# Patient Record
Sex: Female | Born: 1940 | Race: White | Hispanic: No | Marital: Single
Health system: Southern US, Community
[De-identification: ages and names within clinical notes are randomized; demographics above are authoritative.]

---

## 2001-10-08 ENCOUNTER — Other Ambulatory Visit: Admission: RE | Admit: 2001-10-08 | Discharge: 2001-10-08 | Payer: Self-pay | Admitting: Family Medicine

## 2005-01-09 ENCOUNTER — Ambulatory Visit: Payer: Self-pay | Admitting: Family Medicine

## 2005-04-30 ENCOUNTER — Ambulatory Visit: Payer: Self-pay | Admitting: Family Medicine

## 2006-07-02 ENCOUNTER — Ambulatory Visit: Payer: Self-pay | Admitting: Family Medicine

## 2007-07-28 ENCOUNTER — Ambulatory Visit: Payer: Self-pay

## 2008-08-18 ENCOUNTER — Ambulatory Visit: Payer: Self-pay | Admitting: Family Medicine

## 2009-10-24 ENCOUNTER — Ambulatory Visit: Payer: Self-pay | Admitting: Unknown Physician Specialty

## 2010-01-23 ENCOUNTER — Ambulatory Visit: Payer: Self-pay | Admitting: Gastroenterology

## 2011-03-07 ENCOUNTER — Ambulatory Visit: Payer: Self-pay | Admitting: Unknown Physician Specialty

## 2012-03-14 ENCOUNTER — Ambulatory Visit: Payer: Self-pay | Admitting: Unknown Physician Specialty

## 2012-05-20 ENCOUNTER — Ambulatory Visit: Payer: Self-pay | Admitting: Obstetrics and Gynecology

## 2012-05-20 LAB — BASIC METABOLIC PANEL
Anion Gap: 6 — ABNORMAL LOW (ref 7–16)
BUN: 12 mg/dL (ref 7–18)
Co2: 28 mmol/L (ref 21–32)
Creatinine: 0.69 mg/dL (ref 0.60–1.30)
EGFR (African American): >60
EGFR (Non-African Amer.): >60
Glucose: 85 mg/dL (ref 65–99)
Potassium: 4 mmol/L (ref 3.5–5.1)

## 2012-05-20 LAB — CBC
HCT: 38.9 % (ref 35.0–47.0)
HGB: 13.2 g/dL (ref 12.0–16.0)
MCHC: 34 g/dL (ref 32.0–36.0)
Platelet: 237 10*3/uL (ref 150–440)
RBC: 4.17 10*6/uL (ref 3.80–5.20)
RDW: 13.6 % (ref 11.5–14.5)
WBC: 6.4 10*3/uL (ref 3.6–11.0)

## 2012-05-26 ENCOUNTER — Ambulatory Visit: Payer: Self-pay | Admitting: Obstetrics and Gynecology

## 2012-05-27 LAB — HEMOGLOBIN: HGB: 11.6 g/dL — ABNORMAL LOW (ref 12.0–16.0)

## 2012-05-27 LAB — URINALYSIS, COMPLETE
Bilirubin,UR: NEGATIVE
Ph: 5 (ref 4.5–8.0)
Protein: 30
Specific Gravity: 1.028 (ref 1.003–1.030)
Squamous Epithelial: NONE SEEN
WBC UR: NONE SEEN /HPF (ref 0–5)

## 2012-05-27 LAB — CREATININE, SERUM
Creatinine: 0.67 mg/dL (ref 0.60–1.30)
EGFR (African American): >60
EGFR (Non-African Amer.): >60

## 2012-06-14 ENCOUNTER — Emergency Department: Payer: Self-pay | Admitting: Emergency Medicine

## 2012-06-14 LAB — BASIC METABOLIC PANEL
Anion Gap: 9 (ref 7–16)
BUN: 10 mg/dL (ref 7–18)
Calcium, Total: 9.1 mg/dL (ref 8.5–10.1)
Chloride: 105 mmol/L (ref 98–107)
Co2: 27 mmol/L (ref 21–32)
EGFR (African American): >60
EGFR (Non-African Amer.): >60
Osmolality: 281 (ref 275–301)
Sodium: 141 mmol/L (ref 136–145)

## 2012-06-14 LAB — URINALYSIS, COMPLETE
Bacteria: NONE SEEN
Glucose,UR: NEGATIVE mg/dL (ref 0–75)
Nitrite: NEGATIVE
Ph: 7 (ref 4.5–8.0)
Protein: NEGATIVE
RBC,UR: 7 /HPF (ref 0–5)
Squamous Epithelial: NONE SEEN
WBC UR: 2 /HPF (ref 0–5)

## 2012-06-14 LAB — CBC
HCT: 41.7 % (ref 35.0–47.0)
HGB: 14.4 g/dL (ref 12.0–16.0)
MCH: 32.1 pg (ref 26.0–34.0)
MCHC: 34.6 g/dL (ref 32.0–36.0)
RBC: 4.5 10*6/uL (ref 3.80–5.20)

## 2012-06-14 LAB — PROTIME-INR: INR: 0.8

## 2012-09-16 IMAGING — MG MM CAD SCREENING MAMMO
1 series · 4 of 4 positions shown · non-contrast
Comparison: none

REASON FOR EXAM: SCR MAMMO NO ORDER
COMMENTS:  Submitted by practice: Princess Gall [HOSPITAL] Scheduled by
user: Gianmarcos

PROCEDURE:     MAM - MAM DGTL SCRN MAM NO ORDER W/CAD  - March 14, 2012  [DATE]
RESULT:
Comparisons: 03/07/2011 and 10/24/2009.

[R CC · right · 4 of 4 slices shown]
[im 1/4]
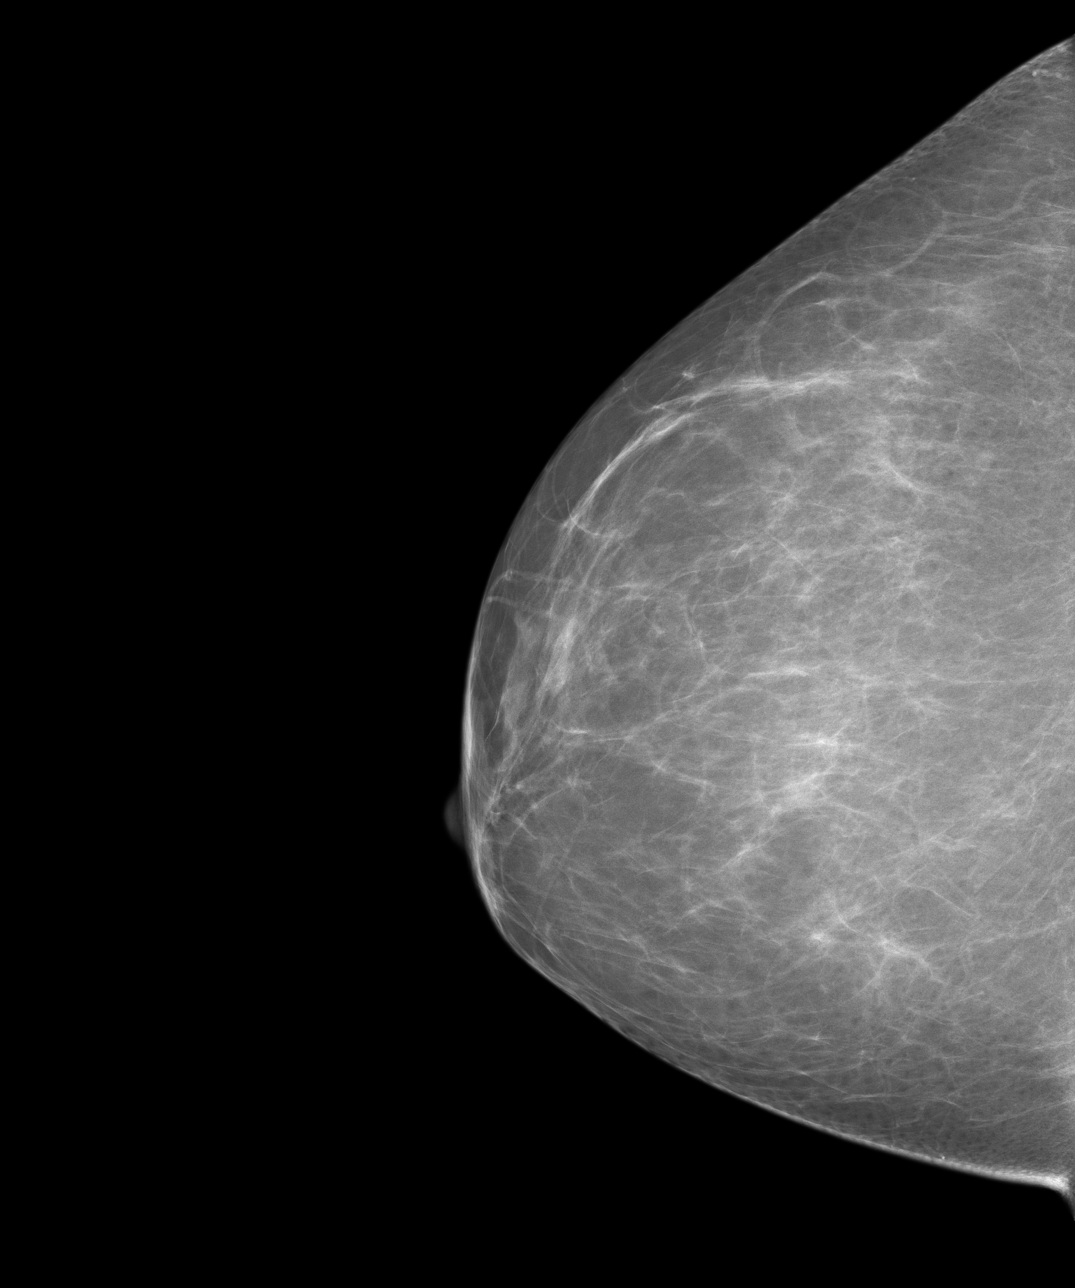
[im 2/4]
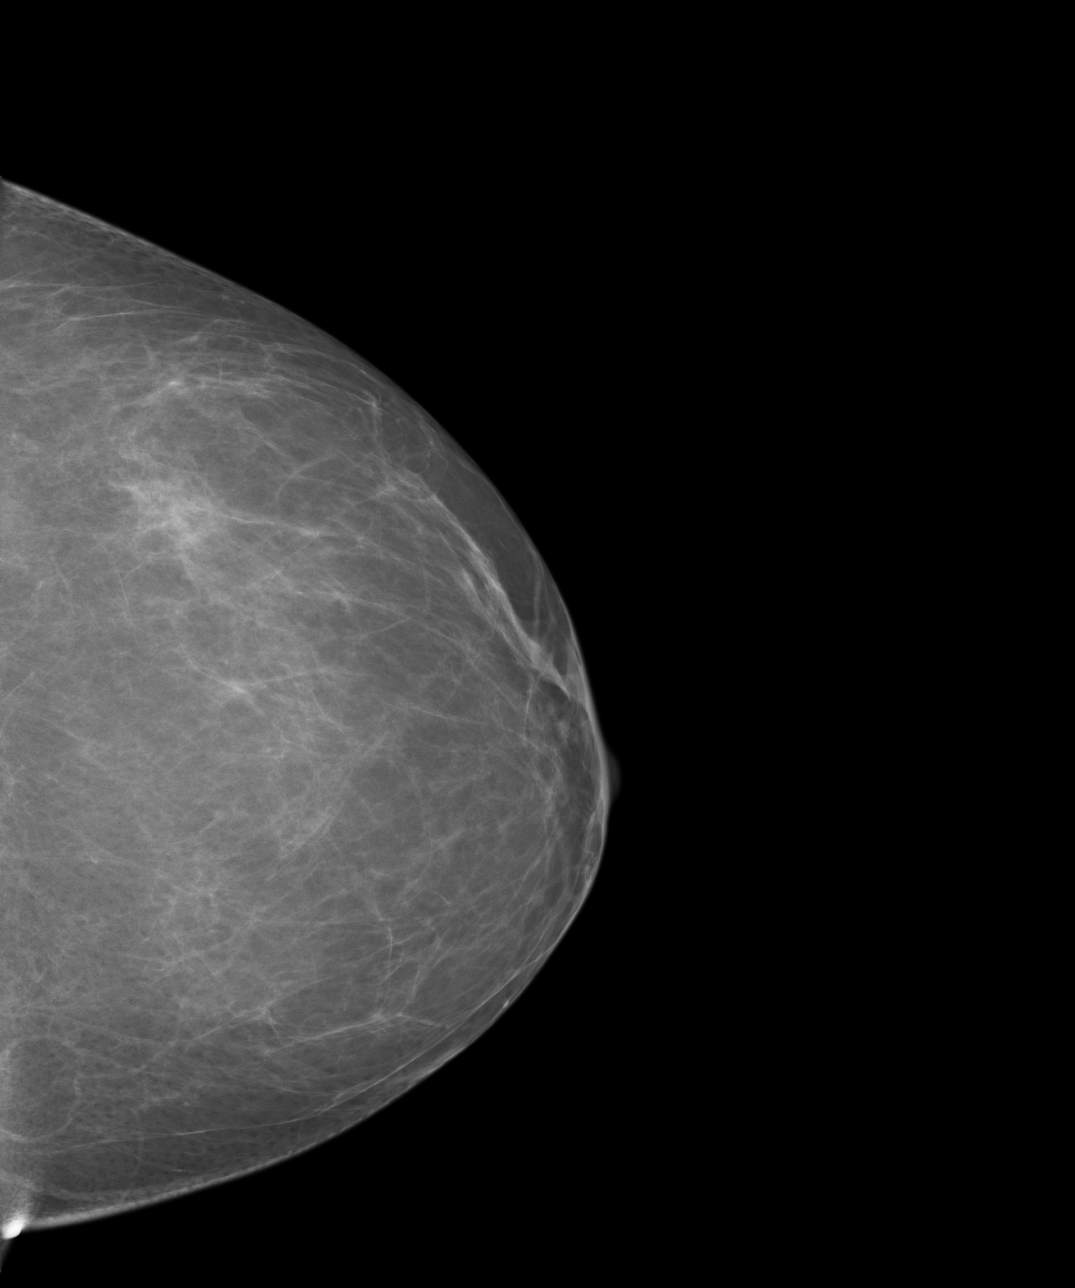
[im 3/4]
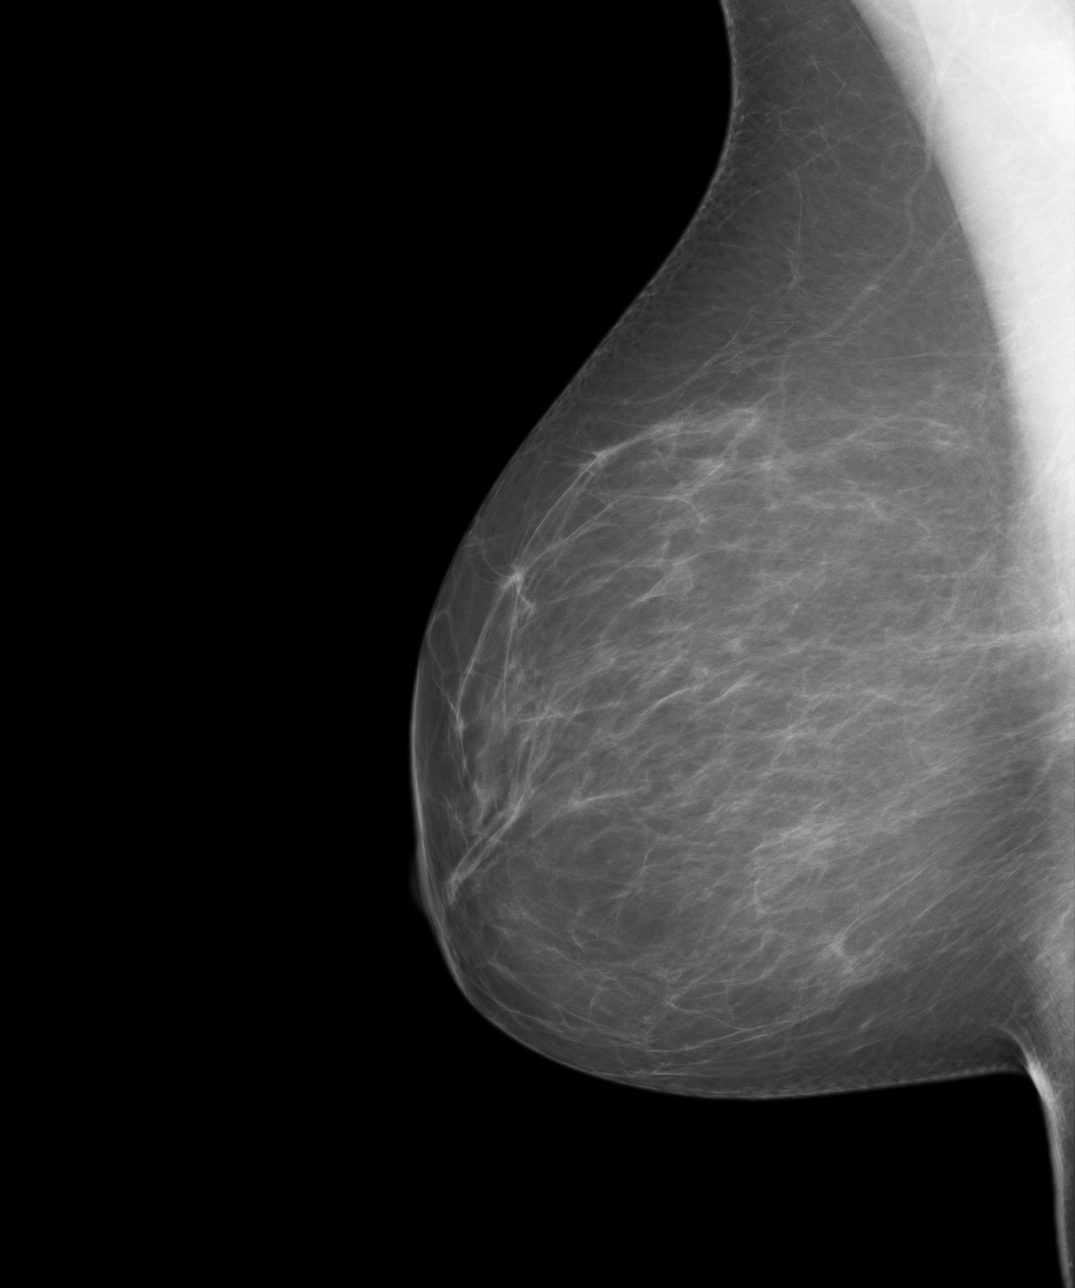
[im 4/4]
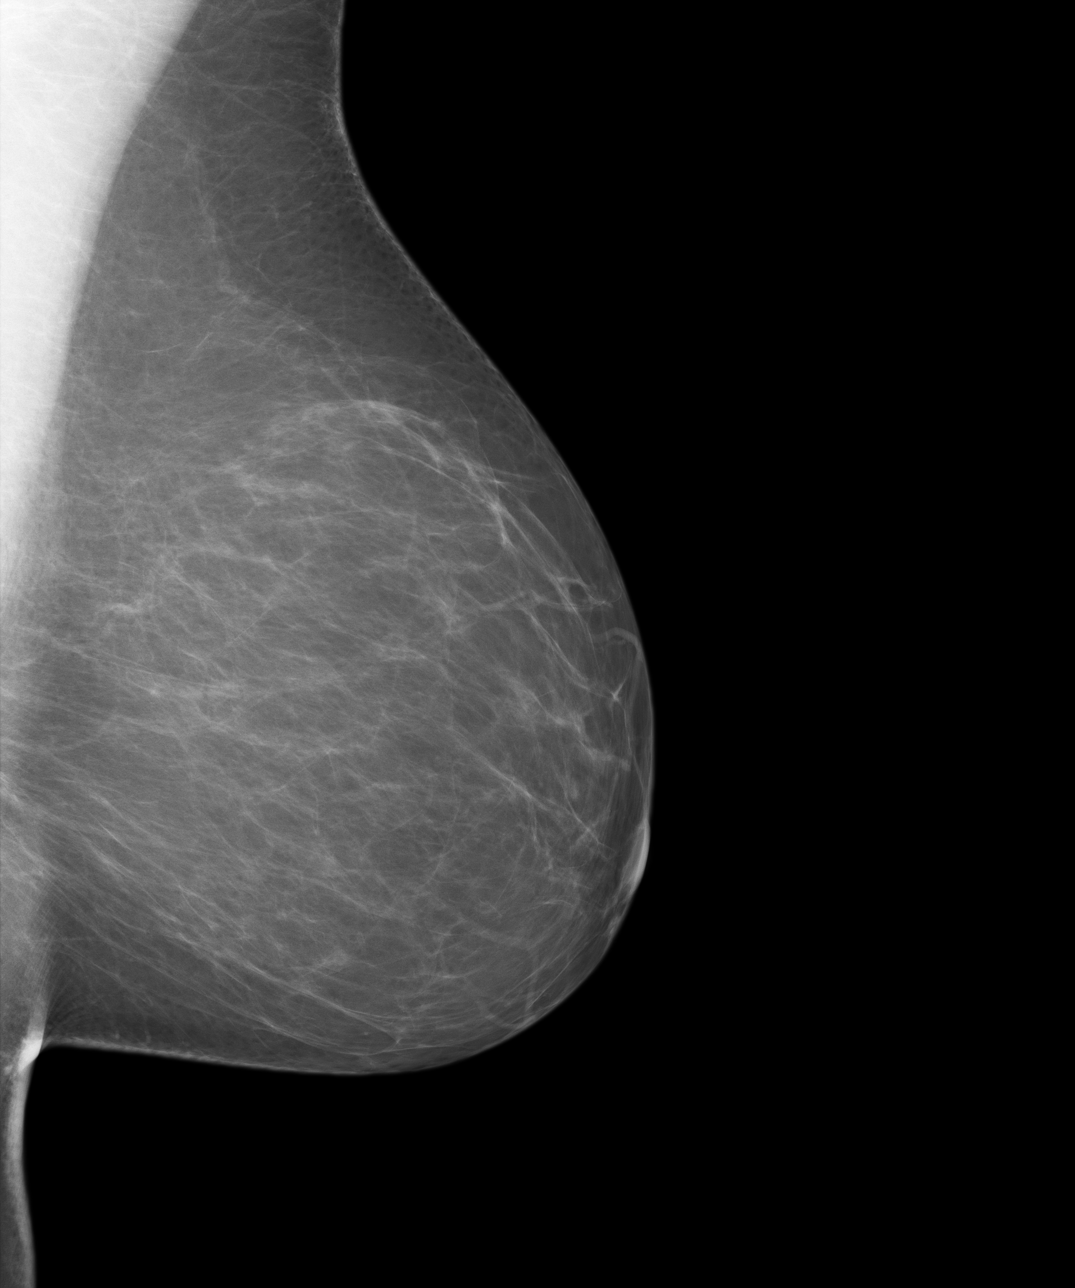

[4 of 4 positions shown; findings below may reference images not displayed]

FINDINGS: The breast tissue is predominant fatty. No suspicious masses or
calcifications are identified. No areas of architectural distortion.
IMPRESSION: BI-RADS: Category 1 - Negative

Recommend continued annual screening mammography.

A NEGATIVE MAMMOGRAM REPORT DOES NOT PRECLUDE BIOPSY OR OTHER EVALUATION OF
A CLINICALLY PALPABLE OR OTHERWISE SUSPICIOUS MASS OR LESION. BREAST CANCER
MAY NOT BE DETECTED BY MAMMOGRAPHY IN UP TO 10% OF CASES.

## 2013-04-01 ENCOUNTER — Ambulatory Visit: Payer: Self-pay

## 2013-04-02 ENCOUNTER — Ambulatory Visit: Payer: Self-pay

## 2013-10-05 IMAGING — MG MM ADDITIONAL VIEWS AT NO CHARGE
1 series · 5 of 5 positions shown · non-contrast
Comparison: none

REASON FOR EXAM: av rt density
COMMENTS:

[R XCCM · right · 5 of 5 slices shown]
[im 1/5]
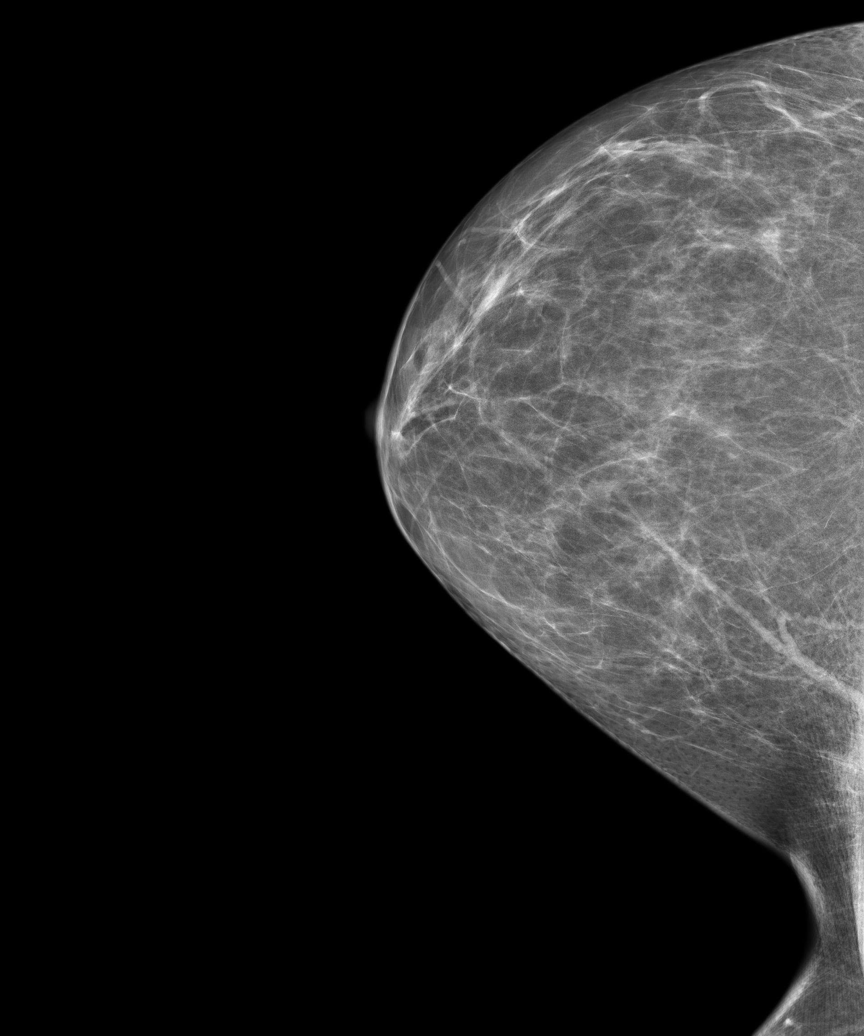
[im 2/5]
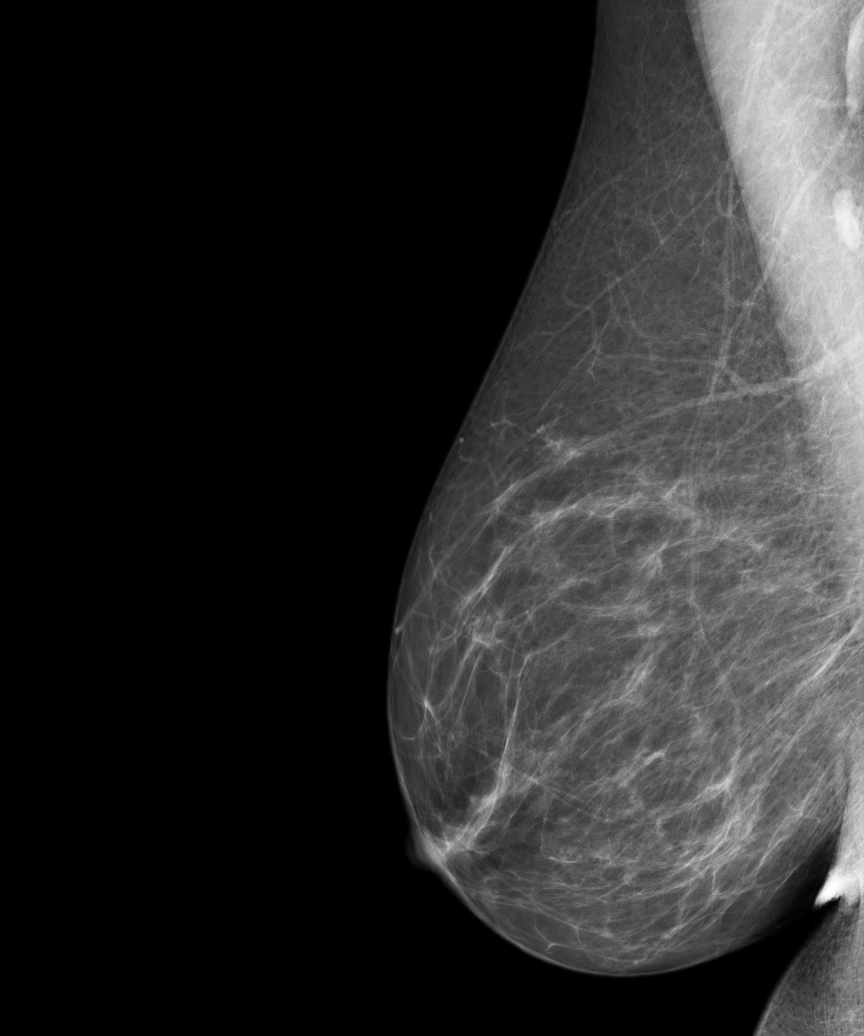
[im 3/5]
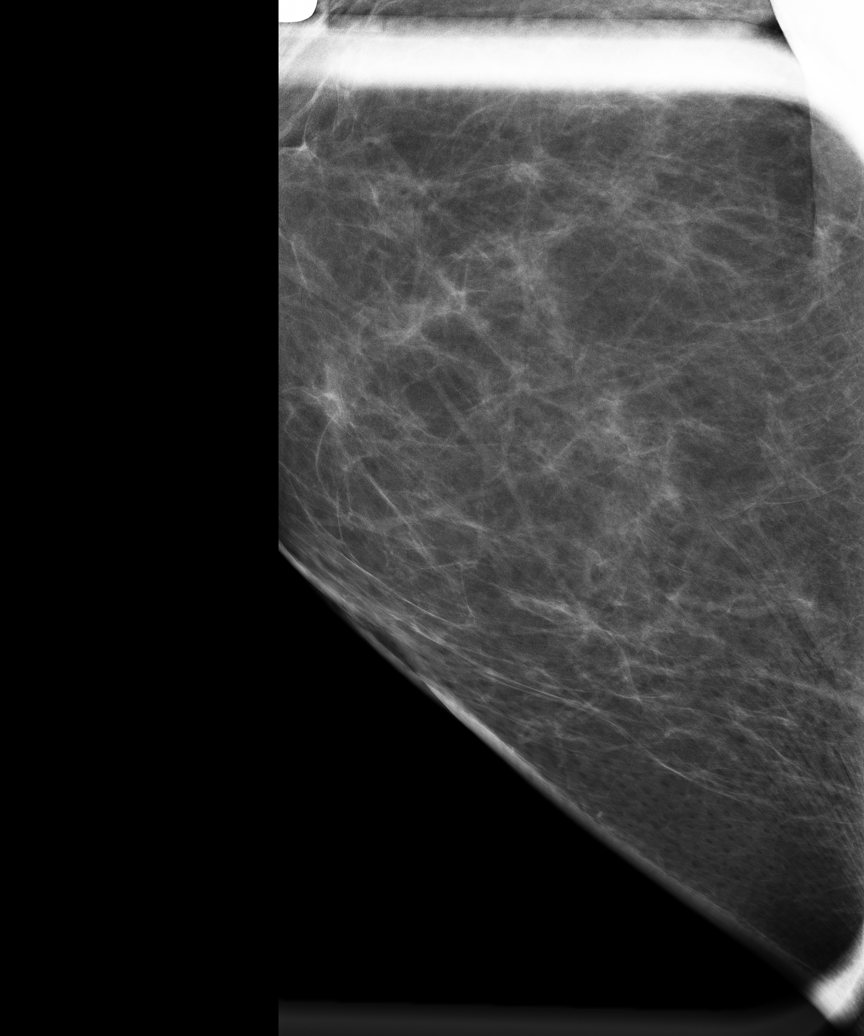
[im 4/5]
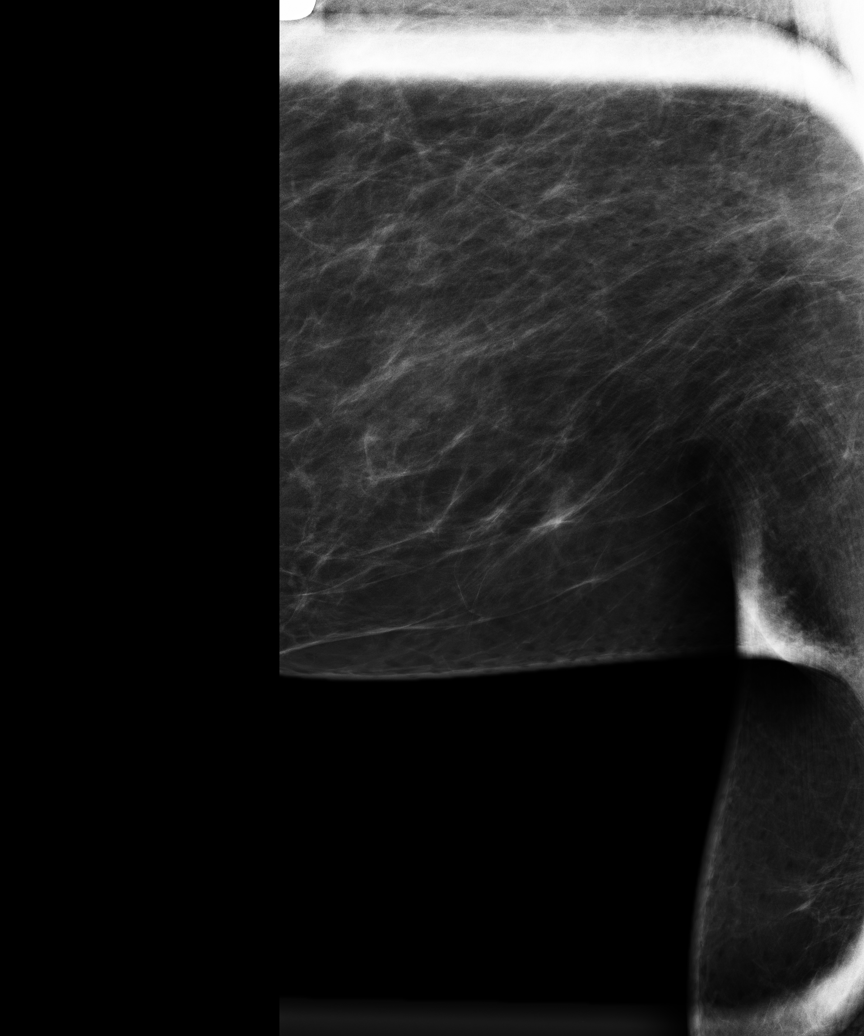
[im 5/5]
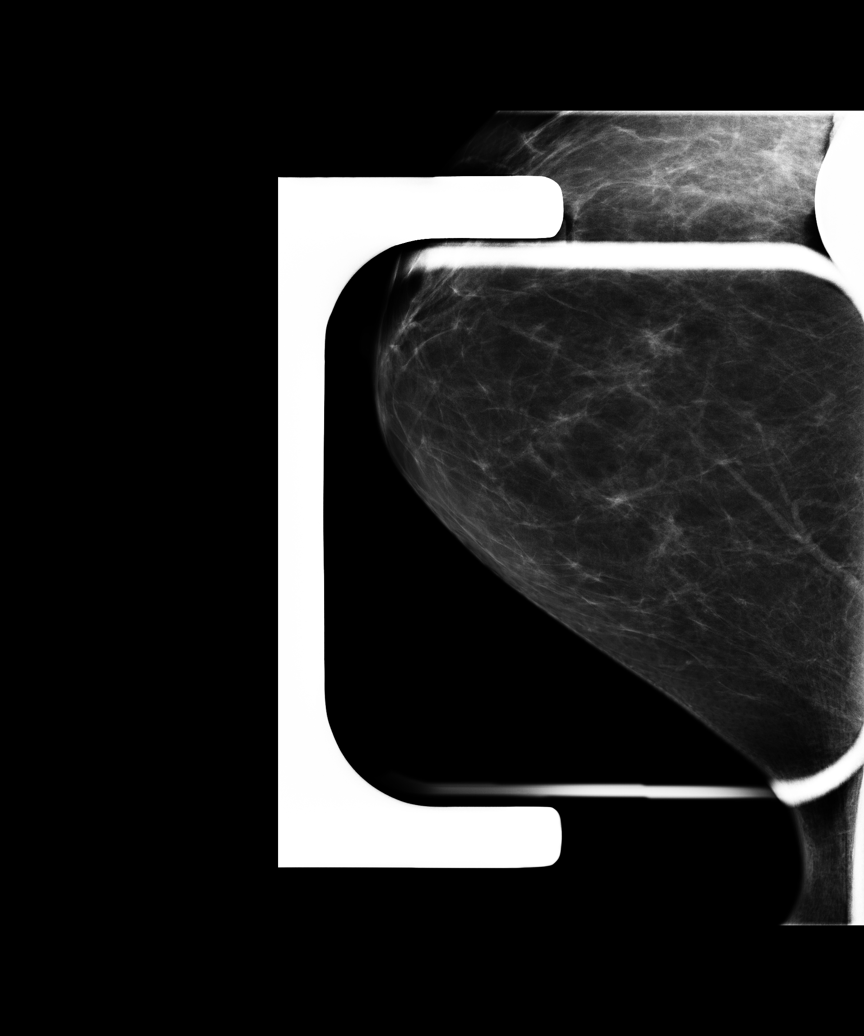

[5 of 5 positions shown; findings below may reference images not displayed]

PROCEDURE:     MAM - MAM DIG ADDVIEWS RT SCR  - April 02, 2013  [DATE]

RESULT:     On the patient's screening study March 02, 2013 density was
noted near the chest wall inferomedially. The patient was asked to return
today for supplemental mammographic views. The area in question is quite
posteriorly positioned and is not clearly evident on the supplemental views.
On the spot compression MLO view there is density demonstrated in the area
of concern that appears reflect normal breast tissue.
IMPRESSION: I do not see definite findings to suggest malignancy. I
feel it would be prudent for the patient to undergo 6 month followup
mammography of the right breast to assure ongoing stability.

BI-RADS 3: Six month followup mammography of the right breast is recommended.

BREAST COMPOSITION: The breast composition is SCATTERED FIBROGLANDULAR
TISSUE (glandular tissue is 25-50%)

A NEGATIVE MAMMOGRAM REPORT DOES NOT PRECLUDE BIOPSY OR OTHER EVALUATION OF
A CLINICALLY PALPABLE OR OTHERWISE SUSPICIOUS MASS OR LESION. BREAST CANCER
MAY NOT BE DETECTED BY MAMMOGRAPHY IN UP TO 10% OF CASES.

Dictation site:1

## 2013-10-06 ENCOUNTER — Ambulatory Visit: Payer: Self-pay

## 2014-05-17 ENCOUNTER — Ambulatory Visit: Payer: Self-pay

## 2015-03-13 NOTE — H&P (Signed)
PATIENT NAME:  Angela Mcguire, Angela Mcguire MR#:  [id number] DATE OF BIRTH:  09/08/1941  DATE OF ADMISSION:  05/26/2012  DATE OF SURGERY: 05/26/2012  DATE OF NOTE: 05/23/2012  PREOPERATIVE DIAGNOSES:  1. Procidentia.  2. Traction cystocele.   HISTORY: Angela Mcguire is a 74 year old white female, para 4-0-0-4, menopausal, using Estrace cream intravaginally nightly in preparation for surgery, presents for surgical management of pelvic organ prolapse. The patient has procidentia with traction cystocele and desires definitive surgery. Previous use of doughnut pessary was unsatisfactory for controlling symptoms. The patient does have some chronic stress incontinence symptomatology along with pelvic pressure. She does not have any bowel symptoms.   PAST MEDICAL HISTORY:  1. Hyperlipidemia.  2. Vitamin D deficiency.  3. Varicose veins.  4. Procidentia with traction cystocele.   PAST SURGICAL HISTORY:  1. Multiple colonoscopies.  2. Knee surgery.   PAST OB HISTORY: Para 4-0-0-4, spontaneous vaginal delivery x4 with the largest baby being 7 pounds 5 ounces.   FAMILY HISTORY: Notable for breast cancer in paternal grandmother. Colon cancer in mother. No history of ovarian cancer.   SOCIAL HISTORY: The patient does not smoke. She did smoke for 20 years, but none recently. She does not drink alcohol. She is a former Immunologistreal estate agent and librarian.   DRUG ALLERGIES: Codeine.   CURRENT MEDICATIONS: Estrace cream intravaginally biweekly. Paxil 10 mg daily.   REVIEW OF SYSTEMS: The patient denies recent illness. She denies coagulopathy. She denies reactive airway disease.   PHYSICAL EXAMINATION:  VITAL SIGNS: Height 63 inches, weight 124 pounds, body mass index 22. Heart rate 61, blood pressure 156/79.   GENERAL: The patient is a pleasant, well-appearing, elderly female in no acute distress. She is alert and oriented. Affect is appropriate.   HEENT: Oropharynx is clear.   NECK: Supple. There is no  thyromegaly or adenopathy.   LUNGS: Clear.   HEART: Regularly irregular heart beat without S3, S4 or murmur.   ABDOMEN: Soft and nontender. No organomegaly.   PELVIC: External genitalia with atrophic changes. BUS normal. Vagina has fair estrogen effect. There is no ulceration or quantification. The uterus is prolapsed to the introitus. There is traction cystocele present.   EXTREMITIES: Without clubbing, cyanosis, or edema.   SKIN: Without rash.   MUSCULOSKELETAL: Exam is normal.   IMPRESSION: Procidentia with traction cystocele.   PLAN:  Transvaginal hysterectomy plus/minus bilateral salpingo-oophorectomy and anterior colporrhaphy. Date of surgery is 05/26/2012.   CONSENT NOTE: Angela Mcguire is to undergo surgery for pelvic organ prolapse. She is understanding of the planned procedures and is aware of and is accepting of all surgical risks which include but are not limited to bleeding, infection, pelvic organ injury with need for repair, blood clot disorders, anesthesia risks, and death. The patient understands that she may have urinary retention post procedure and may need to have prolonged catheterization. She understands that stress incontinence could potentially get worse with the surgical repair, and this may require other interventions. The patient is accepting of all risks. She is ready and willing to proceed with surgery as scheduled. Informed consent is given.    ____________________________ Prentice DockerMartin A. Quade Ramirez, MD mad:vtd D: 05/23/2012 12:12:44 ET Mcguire: 05/23/2012 12:25:48 ET JOB#: [id number]  cc: Daphine DeutscherMartin A. Eddison Searls, MD, <Dictator> Prentice DockerMARTIN A Othal Kubitz MD ELECTRONICALLY SIGNED 05/25/2012 12:02

## 2015-03-13 NOTE — Op Note (Signed)
PATIENT NAME:  Angela Mcguire, Angela Mcguire MR#:  [id number] DATE OF BIRTH:  1941-04-30  DATE OF PROCEDURE:  05/26/2012  PREOPERATIVE DIAGNOSES:  1. Procidentia.  2. Traction cystocele.   POSTOPERATIVE DIAGNOSES:  1. Procidentia.  2. Traction cystocele.   OPERATIVE PROCEDURE: Transvaginal hysterectomy, bilateral salpingo-oophorectomy, and anterior colporrhaphy.   SURGEON: Prentice DockerMartin A. Shreyansh Tiffany, M.D.   FIRST ASSISTANT: None.   ANESTHESIA: General endotracheal.   INDICATIONS: Angela Mcguire is a 74 year old white female, Para 4-0-0-4, menopausal, using Estrace cream intravaginally nightly, presents for surgical management of pelvic organ prolapse.   FINDINGS AT SURGERY: Prolapse of the uterus to just outside the introitus. There was a traction cystocele present. The tubes and ovaries were grossly normal but atrophic.   DESCRIPTION OF PROCEDURE: The patient was brought to the Operating Room where she was placed in a supine position. General endotracheal anesthesia was induced without difficulty. She was placed in the dorsal lithotomy position using the candy-cane stirrups. A Betadine perineal, intravaginal prep and drape was performed in the standard fashion. Foley catheter was placed into the bladder. It was draining clear yellow urine. A weighted speculum was placed into the vagina and a double-tooth tenaculum was placed onto the cervix. Posterior colpotomy was made with Mayo scissors. The uterosacral ligaments were clamped, cut, and stick tied using 0 Vicryl suture. These were tagged. Two other bites of the cardinal broad ligament complex was taken followed by cutting and stick tying with 0 Vicryl. The posterior cuff was run using a baseball stitch of 0 chromic suture. Next, the cervix was circumscribed with a scalpel. The bladder was dissected off the lower uterine segment through sharp and blunt dissection. Eventually, the anterior cul-de-sac was entered. Sequentially, the cardinal broad ligament complexes  were then clamped, cut, and stick tied using 0 Vicryl suture up to the level of the utero-ovarian ligaments. At this point, they were cross clamped and cut and the uterus was removed from the operative field. The adnexal structures were then sequentially identified and the infundibulopelvic ligaments were then crossclamped using a curved Heaney clamp. The tube and ovaries were then excised with Mayo scissors. These pedicles were then doubly ligated using 0 Vicryl suture. The first suture was a free tie with the second being a stick tie. Good hemostasis was attained. Next, the peritoneum was closed using a pursestring stitch of 0 Vicryl. Once the peritoneum was closed, the anterior colporrhaphy was then performed. Allis clamps were used to grasp the lateral margins of the vaginal mucosa in the midline. The vaginal mucosa was undermined with Metzenbaum scissors and incised in the midline. Allis-Adair clamps were used to facilitate exposure. This process was carried out up to within 1 cm of the urethral meatus. At this point, the perivesical fascia was dissected from the vagina through sharp and blunt dissection. Once adequately mobilized, the cystocele was reduced using vertical mattress sutures of 2-0 Vicryl. Excess vaginal mucosa was then trimmed. The vaginal mucosa was then reapproximated in the midline using interrupted sutures of 2-0 chromic. Upon completion of the procedure, a vaginal pack was placed being coated with Premarin cream. The patient was then awakened, mobilized, and extubated, and taken to the recovery room in satisfactory condition.   ESTIMATED BLOOD LOSS: 75 milliliters.  IV FLUIDS: 1,300 milliliters. URINE OUTPUT: 75 milliliters.   ____________________________ Prentice DockerMartin A. Jovie Swanner, MD mad:ap D: 05/27/2012 13:26:58 ET Mcguire: 05/27/2012 13:42:50 ET JOB#: [id number]  cc: Daphine DeutscherMartin A. Briannie Gutierrez, MD, <Dictator> Prentice DockerMARTIN A Daire Okimoto MD ELECTRONICALLY SIGNED 05/29/2012 19:05

## 2021-02-02 MED ORDER — PAROXETINE 10 MG TABLET
10 | ORAL | 2.00 refills | 90.00000 days | Status: AC
Start: 2021-02-02 — End: 2021-05-12

## 2021-02-02 MED ORDER — ASPIRIN 81 MG CHEWABLE TABLET
81 | ORAL | 0.00 refills | 90.00000 days | Status: AC
Start: 2021-02-02 — End: 2021-05-12

## 2021-02-02 MED ORDER — FLUTICASONE PROPIONATE 50 MCG/ACTUATION NASAL SPRAY,SUSPENSION
50 | Freq: Every day | NASAL | 1 refills | 30.00000 days | Status: AC
Start: 2021-02-02 — End: 2021-05-12

## 2021-02-02 MED ORDER — CHOLECALCIFEROL (VITAMIN D3) 50 MCG (2,000 UNIT) CAPSULE
50 | ORAL | 0.00 refills | 90.00000 days | Status: AC
Start: 2021-02-02 — End: 2021-05-12

## 2021-03-06 ENCOUNTER — Inpatient Hospital Stay: Admit: 2021-03-06 | Discharge: 2021-03-06 | Payer: BLUE CROSS/BLUE SHIELD | Primary: Family Medicine

## 2021-03-06 DIAGNOSIS — Z20828 Contact with and (suspected) exposure to other viral communicable diseases: Secondary | ICD-10-CM

## 2021-03-06 DIAGNOSIS — U071 COVID-19: Secondary | ICD-10-CM

## 2021-03-07 DIAGNOSIS — U071 COVID-19: Secondary | ICD-10-CM

## 2021-03-07 LAB — SARS COV-2 (COVID-19) RNA-~~LOC~~ LABS (BH GH LMW YH): BKR SARS-COV-2 RNA (COVID-19) (YH): DETECTED — AB

## 2021-03-07 NOTE — Other
Spoke with Krista Koch about their COVID-19 test results. Result is POSITIVE. Krista Koch reports feeling nasal congestion, sore throat. Advised to stay home, self isolate for 5 days after positive test or 5 days after onset of symptoms and wear a mask from day 6-10, maintain at least 6 feet away from others, wear a mask, observe symptoms, handwashing x 20 seconds, and disinfect commonly used surfaces. Instructed to contact PCP for further advice on Covid-19 Positive test result. Instructed to call PCP or [facility] at [facility] ([phone]) for any changes in condition. MD already ordered moab infusion.

## 2021-03-09 ENCOUNTER — Inpatient Hospital Stay: Admit: 2021-03-09 | Discharge: 2021-03-09 | Payer: BLUE CROSS/BLUE SHIELD

## 2021-03-09 MED ORDER — EPINEPHRINE 0.3 MG/0.3 ML INJECTION, AUTO-INJECTOR
0.3 mg/ mL | INTRAMUSCULAR | Status: DC | PRN
Start: 2021-03-09 — End: 2021-03-09

## 2021-03-09 MED ORDER — FAMOTIDINE 4 MG/ML IN 0.9% SODIUM CHLORIDE (ADULT)
Freq: Once | INTRAVENOUS | Status: DC | PRN
Start: 2021-03-09 — End: 2021-03-09

## 2021-03-09 MED ORDER — ALBUTEROL SULFATE HFA 90 MCG/ACTUATION AEROSOL INHALER
90 mcg/actuation | RESPIRATORY_TRACT | Status: DC | PRN
Start: 2021-03-09 — End: 2021-03-09

## 2021-03-09 MED ORDER — DIPHENHYDRAMINE 50 MG/ML INJECTION (WRAPPED E-RX)
50 mg/mL | Freq: Once | INTRAVENOUS | Status: DC | PRN
Start: 2021-03-09 — End: 2021-03-09

## 2021-03-09 MED ORDER — BEBTELOVIMAB 175 MG/2 ML (87.5 MG/ML) INTRAVENOUS SOLUTION (UNAPP)
175 mg/2 mL (87.5 mg/mL) | Freq: Once | INTRAVENOUS | Status: DC
Start: 2021-03-09 — End: 2021-03-09

## 2021-03-09 MED ORDER — SODIUM CHLORIDE 0.9 % BOLUS (NEW BAG)
0.9 % | Freq: Once | INTRAVENOUS | Status: DC | PRN
Start: 2021-03-09 — End: 2021-03-09

## 2021-03-09 MED ORDER — HYDROCORTISONE SODIUM SUCCINATE 100 MG SOLUTION FOR INJECTION
100 mg | Freq: Once | INTRAVENOUS | Status: DC | PRN
Start: 2021-03-09 — End: 2021-03-09

## 2021-03-09 MED ORDER — SODIUM CHLORIDE 0.9 % (FLUSH) INJECTION SYRINGE
0.9 % | Status: DC | PRN
Start: 2021-03-09 — End: 2021-03-09

## 2021-03-09 NOTE — ED Notes
7:18 AM [phone] Jonny Ruiz

## 2021-03-09 NOTE — ED Notes
8:12 AM - when scanning Pt bracelet for med admin PT reports she does not want the medicine abd states i'm fine I dont need any medicine. Pt educated on why she is here and what med she is getting and Pt requests to leave at this time.

## 2021-03-11 MED ORDER — NIRMATRELVIR 300 MG (150 MG X2)-RITONAVIR 100 MG TABLET,DOSE PACK
300 | ORAL | 1 refills | 5.00000 days | Status: AC
Start: 2021-03-11 — End: ?

## 2021-03-11 MED ORDER — NIRMATRELVIR 300 MG (150 MG X2)-RITONAVIR 100 MG TABLET,DOSE PACK
300 | ORAL | 1 refills | 5.00000 days | Status: AC
Start: 2021-03-11 — End: 2021-03-11

## 2021-05-12 MED ORDER — TRAZODONE 50 MG TABLET
50 | Freq: Every evening | ORAL | 1.00 refills | 30.00000 days | Status: AC
Start: 2021-05-12 — End: 2021-05-23

## 2021-05-18 ENCOUNTER — Inpatient Hospital Stay: Admit: 2021-05-18 | Discharge: 2021-05-18 | Payer: BLUE CROSS/BLUE SHIELD | Primary: Family Medicine

## 2021-05-18 DIAGNOSIS — F0391 Unspecified dementia with behavioral disturbance: Secondary | ICD-10-CM

## 2021-05-18 MED ORDER — DIAZEPAM 2 MG TABLET
2 | ORAL_TABLET | Freq: Once | ORAL | 1 refills | 10.00000 days | Status: AC | PRN
Start: 2021-05-18 — End: 2021-06-20

## 2021-05-23 ENCOUNTER — Inpatient Hospital Stay: Admit: 2021-05-23 | Discharge: 2021-05-23 | Payer: BLUE CROSS/BLUE SHIELD | Primary: Family Medicine

## 2021-05-23 LAB — CBC WITH AUTO DIFFERENTIAL
BKR WAM ABSOLUTE IMMATURE GRANULOCYTES.: 0.02 x 1000/ÂµL (ref 0.00–0.30)
BKR WAM ABSOLUTE LYMPHOCYTE COUNT.: 0.69 x 1000/ÂµL (ref 0.60–3.70)
BKR WAM ABSOLUTE NEUTROPHIL COUNT.: 4.72 x 1000/ÂµL (ref 2.00–7.60)
BKR WAM BASOPHIL ABSOLUTE COUNT.: 0.04 x 1000/ÂµL (ref 0.00–1.00)
BKR WAM BASOPHILS: 0.7 % (ref 0.0–1.4)
BKR WAM EOSINOPHIL ABSOLUTE COUNT.: 0.05 x 1000/ÂµL (ref 0.00–1.00)
BKR WAM EOSINOPHILS: 0.8 % (ref 0.0–5.0)
BKR WAM HEMATOCRIT (2 DEC): 40.4 % (ref 35.00–45.00)
BKR WAM HEMOGLOBIN: 13.3 g/dL (ref 11.7–15.5)
BKR WAM IMMATURE GRANULOCYTES: 0.3 % (ref 0.0–1.0)
BKR WAM LYMPHOCYTES: 11.6 % — ABNORMAL LOW (ref 17.0–50.0)
BKR WAM MCH PG: 31.4 pg (ref 27.0–33.0)
BKR WAM MCHC: 32.9 g/dL (ref 31.0–36.0)
BKR WAM MCV: 95.3 fL (ref 80.0–100.0)
BKR WAM MONOCYTE ABSOLUTE COUNT.: 0.43 x 1000/ÂµL (ref 0.00–1.00)
BKR WAM MONOCYTES: 7.2 % (ref 4.0–12.0)
BKR WAM MPV: 10.7 fL (ref 8.0–12.0)
BKR WAM NEUTROPHILS: 79.4 % — ABNORMAL HIGH (ref 39.0–72.0)
BKR WAM NUCLEATED RED BLOOD CELLS: 0 % (ref 0.0–1.0)
BKR WAM PLATELETS: 308 x1000/ÂµL (ref 150–420)
BKR WAM RDW-CV: 15.5 % — ABNORMAL HIGH (ref 11.0–15.0)
BKR WAM RED BLOOD CELL COUNT.: 4.24 M/ÂµL (ref 4.00–6.00)
BKR WAM WHITE BLOOD CELL COUNT: 6 x1000/ÂµL (ref 4.0–11.0)

## 2021-05-23 LAB — VITAMIN B12: BKR VITAMIN B12: 334 pg/mL (ref 211–911)

## 2021-05-23 LAB — COMPREHENSIVE METABOLIC PANEL
BKR ALANINE AMINOTRANSFERASE (ALT): 26 U/L (ref 13–56)
BKR ALBUMIN: 3.9 g/dL (ref 3.4–5.0)
BKR ALKALINE PHOSPHATASE: 66 U/L (ref 45–117)
BKR ANION GAP (LM): 5 mmol/L (ref 5–15)
BKR ASPARTATE AMINOTRANSFERASE (AST): 20 U/L (ref 15–37)
BKR BILIRUBIN TOTAL: 0.7 mg/dL (ref <1.0)
BKR BLOOD UREA NITROGEN: 9 mg/dL (ref 7–18)
BKR CALCIUM: 9 mg/dL (ref 8.5–10.1)
BKR CHLORIDE: 109 mmol/L — ABNORMAL HIGH (ref 98–107)
BKR CO2: 27 mmol/L (ref 21–32)
BKR CREATININE: 0.84 mg/dL (ref 0.55–1.02)
BKR EGFR (AFR AMER) (LMC): >60 mL/min/{1.73_m2} (ref >60)
BKR EGFR (NON AFR AMER) (LMC): >60 mL/min/{1.73_m2} (ref >60)
BKR GLOBULIN: 2.9 g/dL (ref 2.5–5.0)
BKR GLUCOSE: 85 mg/dL (ref 65–110)
BKR POTASSIUM: 4.3 mmol/L (ref 3.5–5.1)
BKR PROTEIN TOTAL: 6.8 g/dL (ref 6.4–8.2)
BKR SODIUM: 141 mmol/L (ref 136–145)

## 2021-05-23 LAB — TREPONEMA PALLIDUM (SYPHILIS) ANTIBODY W/REFLEX
BKR TREPONEMA PALLIDUM ANTIBODY INITIAL RESULT: <0.1 {index} (ref <0.9)
BKR TREPONEMA PALLIDUM ANTIBODY TOTAL, SERUM: NONREACTIVE

## 2021-05-23 LAB — TSH W/REFLEX TO FT4     (BH GH LMW Q YH): BKR THYROID STIMULATING HORMONE (LM): 2.97 u[IU]/mL (ref 0.36–3.74)

## 2021-05-23 LAB — FOLATE: BKR FOLATE: 13.2 ng/mL (ref >5.4)

## 2021-05-23 MED ORDER — TRAZODONE 100 MG TABLET
100 | ORAL_TABLET | Freq: Every evening | ORAL | 2 refills | 30.00000 days | Status: AC
Start: 2021-05-23 — End: 2021-06-20

## 2021-05-25 LAB — CARBOHYDRATE DEFICIENT TRANSFERRIN     (BH GH LMW Q YH)
% CDT: 1.5 % (ref <2.5)
CARBOHYDRATE DEFICIENT TRANSFERRIN: 34.2 mg/L (ref 28.1–76.0)
CDT: 234 mg/dL (ref 188–341)

## 2021-06-20 MED ORDER — CALCIUM 600 MG (AS CARBONATE)-VIT D3 10 MCG (400 UNIT) CHEWABLE TABLET
600 | ORAL_TABLET | Freq: Every day | ORAL | 4 refills | 30.00000 days | Status: AC
Start: 2021-06-20 — End: 2022-05-21

## 2021-06-20 MED ORDER — DENOSUMAB 60 MG/ML SUBCUTANEOUS SYRINGE
60 | SUBCUTANEOUS | 2 refills | 180.00000 days | Status: AC
Start: 2021-06-20 — End: 2022-05-21

## 2021-06-20 MED ORDER — MIRTAZAPINE 7.5 MG TABLET
7.5 | ORAL_TABLET | Freq: Every evening | ORAL | 4 refills | 30.00000 days | Status: AC
Start: 2021-06-20 — End: 2021-06-27

## 2021-06-27 MED ORDER — DENOSUMAB 60 MG/ML SUBCUTANEOUS SYRINGE
60 | Freq: Once | SUBCUTANEOUS | Status: DC
Start: 2021-06-27 — End: 2021-06-27

## 2021-06-27 MED ORDER — MIRTAZAPINE 15 MG TABLET
15 | ORAL_TABLET | Freq: Every evening | ORAL | 4 refills | 30.00000 days | Status: AC
Start: 2021-06-27 — End: 2021-07-18

## 2021-07-04 MED ORDER — DONEPEZIL 10 MG TABLET
10 | ORAL | 2.00 refills | 90.00000 days | Status: AC
Start: 2021-07-04 — End: 2022-05-21

## 2021-07-04 MED ORDER — BUSPIRONE 5 MG TABLET
5 | ORAL_TABLET | Freq: Three times a day (TID) | ORAL | 4 refills | 30.00000 days | Status: AC | PRN
Start: 2021-07-04 — End: 2021-07-18

## 2021-07-18 MED ORDER — BUSPIRONE 10 MG TABLET
10 | ORAL_TABLET | Freq: Three times a day (TID) | ORAL | 4 refills | 30.00000 days | Status: AC | PRN
Start: 2021-07-18 — End: 2021-09-18

## 2021-07-18 MED ORDER — DICLOFENAC 1 % TOPICAL GEL
1 | Freq: Four times a day (QID) | TOPICAL | 4 refills | 25.00000 days | Status: AC | PRN
Start: 2021-07-18 — End: 2022-05-21

## 2021-07-18 MED ORDER — B-12 DOTS ORAL
ORAL | 0.00 refills | 1.00000 days | Status: AC
Start: 2021-07-18 — End: 2022-05-21

## 2021-07-18 MED ORDER — GUAR GUM-CALCIUM CARBONATE ORAL
ORAL | 1.00 refills | 15.00000 days | Status: AC
Start: 2021-07-18 — End: 2021-09-18

## 2021-07-18 MED ORDER — MIRTAZAPINE 30 MG TABLET
30 | ORAL_TABLET | Freq: Every evening | ORAL | 4 refills | 30.00000 days | Status: AC
Start: 2021-07-18 — End: 2022-05-21

## 2021-09-18 MED ORDER — SUVOREXANT 10 MG TABLET
10 | ORAL_TABLET | Freq: Every evening | ORAL | 4 refills | 30.00000 days | Status: AC
Start: 2021-09-18 — End: 2022-05-21

## 2021-09-18 MED ORDER — CLONIDINE HCL 0.1 MG TABLET
0.1 | ORAL_TABLET | Freq: Two times a day (BID) | ORAL | 6 refills | 30.00000 days | Status: AC | PRN
Start: 2021-09-18 — End: 2021-09-20

## 2021-09-19 MED ORDER — QUETIAPINE IMMEDIATE RELEASE 25 MG TABLET
25 | ORAL_TABLET | Freq: Every day | ORAL | 4 refills | 30.00000 days | Status: AC | PRN
Start: 2021-09-19 — End: 2022-05-21

## 2021-09-19 MED ORDER — DIAZEPAM 2 MG TABLET
2 | ORAL_TABLET | Freq: Three times a day (TID) | ORAL | 1 refills | 10.00000 days | Status: AC | PRN
Start: 2021-09-19 — End: 2022-05-21

## 2021-09-25 ENCOUNTER — Encounter: Admit: 2021-09-25 | Payer: PRIVATE HEALTH INSURANCE | Primary: Family Medicine

## 2021-09-25 ENCOUNTER — Ambulatory Visit: Admit: 2021-09-25 | Payer: BLUE CROSS/BLUE SHIELD | Primary: Family Medicine

## 2021-10-23 ENCOUNTER — Encounter: Admit: 2021-10-23 | Payer: BLUE CROSS/BLUE SHIELD | Attending: Neuromuscular Medicine | Primary: Family Medicine

## 2022-04-20 ENCOUNTER — Telehealth: Admit: 2022-04-20 | Payer: PRIVATE HEALTH INSURANCE | Primary: Family Medicine

## 2022-05-21 ENCOUNTER — Ambulatory Visit: Admit: 2022-05-21 | Payer: Medicare (Managed Care) | Attending: Family Medicine | Primary: Family Medicine

## 2022-05-21 ENCOUNTER — Inpatient Hospital Stay: Admit: 2022-05-21 | Discharge: 2022-05-21 | Payer: PRIVATE HEALTH INSURANCE | Primary: Family Medicine

## 2022-05-21 ENCOUNTER — Encounter: Admit: 2022-05-21 | Payer: PRIVATE HEALTH INSURANCE | Attending: Family Medicine | Primary: Family Medicine

## 2022-05-21 DIAGNOSIS — R300 Dysuria: Secondary | ICD-10-CM

## 2022-05-21 DIAGNOSIS — F03911 Unspecified dementia, unspecified severity, with agitation: Secondary | ICD-10-CM

## 2022-05-21 DIAGNOSIS — R3129 Other microscopic hematuria: Secondary | ICD-10-CM

## 2022-05-21 LAB — COMPREHENSIVE METABOLIC PANEL
BKR ALANINE AMINOTRANSFERASE (ALT): 27 U/L (ref 13–56)
BKR ALBUMIN: 3.8 g/dL (ref 3.4–5.0)
BKR ALKALINE PHOSPHATASE: 75 U/L (ref 45–117)
BKR ANION GAP (LM): 6 mmol/L (ref 5–15)
BKR ASPARTATE AMINOTRANSFERASE (AST): 16 U/L (ref 15–37)
BKR BILIRUBIN TOTAL: 0.7 mg/dL (ref 0.0–<1.0)
BKR BLOOD UREA NITROGEN: 16 mg/dL (ref 7–18)
BKR CALCIUM: 9.1 mg/dL (ref 8.5–10.1)
BKR CHLORIDE: 105 mmol/L (ref 98–107)
BKR CO2: 27 mmol/L (ref 21–32)
BKR CREATININE: 0.59 mg/dL (ref 0.55–1.02)
BKR EGFR, CREATININE (CKD-EPI 2021): >60 mL/min/{1.73_m2} (ref >=60)
BKR GLOBULIN: 3 g/dL (ref 2.5–5.0)
BKR GLUCOSE: 92 mg/dL (ref 65–110)
BKR POTASSIUM: 3.7 mmol/L (ref 3.5–5.1)
BKR PROTEIN TOTAL: 6.8 g/dL (ref 6.4–8.2)
BKR SODIUM: 138 mmol/L (ref 136–145)
BKR WAM LYMPHOCYTES: 6.8 g/dL (ref 6.4–8.2)
BKR WAM PLATELETS: 16 mg/dL (ref 7–18)

## 2022-05-21 LAB — CBC WITH AUTO DIFFERENTIAL
BKR WAM ABSOLUTE IMMATURE GRANULOCYTES.: 0.03 x 1000/??L (ref 0.00–0.30)
BKR WAM ABSOLUTE LYMPHOCYTE COUNT.: 1.56 x 1000/??L (ref 0.60–3.70)
BKR WAM ABSOLUTE NEUTROPHIL COUNT.: 6.07 x 1000/??L (ref 2.00–7.60)
BKR WAM BASOPHIL ABSOLUTE COUNT.: 0.04 x 1000/??L (ref 0.00–1.00)
BKR WAM BASOPHILS: 0.5 % (ref 0.0–1.4)
BKR WAM EOSINOPHIL ABSOLUTE COUNT.: 0.07 x 1000/??L (ref 0.00–1.00)
BKR WAM EOSINOPHILS: 0.8 % (ref 0.0–5.0)
BKR WAM HEMATOCRIT (2 DEC): 40.6 % (ref 35.00–45.00)
BKR WAM HEMOGLOBIN: 13.8 g/dL (ref 11.7–15.5)
BKR WAM IMMATURE GRANULOCYTES: 0.4 % (ref 0.0–1.0)
BKR WAM MCH PG: 31.4 pg (ref 27.0–33.0)
BKR WAM MCHC: 34 g/dL (ref 31.0–36.0)
BKR WAM MCV: 92.5 fL (ref 80.0–100.0)
BKR WAM MONOCYTE ABSOLUTE COUNT.: 0.67 x 1000/??L (ref 0.00–1.00)
BKR WAM MONOCYTES: 7.9 % (ref 4.0–12.0)
BKR WAM MPV: 10.6 fL (ref 8.0–12.0)
BKR WAM NEUTROPHILS: 71.9 % (ref 39.0–72.0)
BKR WAM NUCLEATED RED BLOOD CELLS: 0 % (ref 0.0–1.0)
BKR WAM RDW-CV: 14.4 % (ref 11.0–15.0)
BKR WAM RED BLOOD CELL COUNT.: 4.39 M/ÂµL (ref 4.00–6.00)
BKR WAM WHITE BLOOD CELL COUNT: 8.4 x1000/??L (ref 4.0–11.0)

## 2022-05-21 MED ORDER — DIAZEPAM 2 MG TABLET
2 | ORAL_TABLET | Freq: Four times a day (QID) | ORAL | 1 refills | 10.00000 days | Status: AC | PRN
Start: 2022-05-21 — End: ?

## 2022-05-21 MED ORDER — CEPHALEXIN 500 MG CAPSULE
500 | ORAL_CAPSULE | Freq: Three times a day (TID) | ORAL | 1 refills | 7.00000 days | Status: AC
Start: 2022-05-21 — End: ?

## 2022-05-21 MED ORDER — OLANZAPINE 2.5 MG TABLET
2.5 | ORAL_TABLET | Freq: Every day | ORAL | 1 refills | 30.00000 days | Status: AC
Start: 2022-05-21 — End: 2022-06-13

## 2022-05-21 MED ORDER — CYANOCOBALAMIN (VIT B-12) 1,000 MCG/ML INJECTION SOLUTION
1000 | ORAL | 0.00 refills | 84.00000 days | Status: AC
Start: 2022-05-21 — End: 2023-01-29

## 2022-05-21 MED ORDER — BD INTEGRA SYRINGE 3 ML 25 GAUGE X 5/8
3 | 2.00 refills | 29.00000 days | Status: AC
Start: 2022-05-21 — End: 2023-01-29

## 2022-05-22 LAB — URINE CULTURE

## 2022-05-23 NOTE — Other
Her urine did not show any infection blood work within normal limits.  How is she doing on the medication?

## 2022-05-24 NOTE — Other
Daughter states pt feels OK, but is going to finish full dose of abx as recommended. She will call next week if symptoms return.

## 2022-05-31 ENCOUNTER — Telehealth: Admit: 2022-05-31 | Payer: PRIVATE HEALTH INSURANCE | Attending: Family Medicine | Primary: Family Medicine

## 2022-06-04 ENCOUNTER — Encounter: Admit: 2022-06-04 | Payer: PRIVATE HEALTH INSURANCE | Attending: Family Medicine | Primary: Family Medicine

## 2022-06-11 ENCOUNTER — Telehealth: Admit: 2022-06-11 | Payer: PRIVATE HEALTH INSURANCE | Attending: Family Medicine | Primary: Family Medicine

## 2022-06-12 ENCOUNTER — Telehealth: Admit: 2022-06-12 | Payer: PRIVATE HEALTH INSURANCE | Attending: Family Medicine | Primary: Family Medicine

## 2022-06-13 ENCOUNTER — Encounter: Admit: 2022-06-13 | Payer: PRIVATE HEALTH INSURANCE | Attending: Family Medicine | Primary: Family Medicine

## 2022-06-13 MED ORDER — OLANZAPINE 5 MG TABLET
5 | ORAL_TABLET | Freq: Every day | ORAL | 1 refills | 30.00000 days | Status: AC
Start: 2022-06-13 — End: 2022-07-16

## 2022-06-27 ENCOUNTER — Ambulatory Visit: Admit: 2022-06-27 | Payer: Medicare (Managed Care) | Primary: Family Medicine

## 2022-06-27 MED ORDER — AMOXICILLIN 875 MG-POTASSIUM CLAVULANATE 125 MG TABLET
875-125 | ORAL_TABLET | Freq: Two times a day (BID) | ORAL | 1 refills | 10.00000 days | Status: AC
Start: 2022-06-27 — End: ?

## 2022-07-09 ENCOUNTER — Telehealth: Admit: 2022-07-09 | Payer: PRIVATE HEALTH INSURANCE | Primary: Family Medicine

## 2022-07-10 ENCOUNTER — Encounter: Admit: 2022-07-10 | Payer: Medicare (Managed Care) | Attending: Family Medicine | Primary: Family Medicine

## 2022-07-16 ENCOUNTER — Encounter: Admit: 2022-07-16 | Payer: PRIVATE HEALTH INSURANCE | Primary: Family Medicine

## 2022-07-16 MED ORDER — OLANZAPINE 5 MG TABLET
5 | ORAL_TABLET | Freq: Every day | ORAL | 1 refills | 30.00000 days | Status: AC
Start: 2022-07-16 — End: 2022-07-26

## 2022-07-26 ENCOUNTER — Encounter: Admit: 2022-07-26 | Payer: Medicare (Managed Care) | Attending: Family Medicine | Primary: Family Medicine

## 2022-07-26 MED ORDER — OLANZAPINE 5 MG TABLET
5 | ORAL_TABLET | Freq: Every evening | ORAL | 1 refills | 30.00000 days | Status: AC
Start: 2022-07-26 — End: 2022-08-09

## 2022-07-26 MED ORDER — MELATONIN 5 MG CHEWABLE TABLET
5 | ORAL | 0.00 refills | 30.00000 days | Status: AC
Start: 2022-07-26 — End: 2022-07-26

## 2022-07-28 ENCOUNTER — Encounter: Admit: 2022-07-28 | Payer: PRIVATE HEALTH INSURANCE | Attending: Family Medicine | Primary: Family Medicine

## 2022-08-06 ENCOUNTER — Encounter: Admit: 2022-08-06 | Payer: PRIVATE HEALTH INSURANCE | Attending: Family Medicine | Primary: Family Medicine

## 2022-08-09 ENCOUNTER — Encounter: Admit: 2022-08-09 | Payer: PRIVATE HEALTH INSURANCE | Primary: Family Medicine

## 2022-08-09 ENCOUNTER — Ambulatory Visit: Admit: 2022-08-09 | Payer: PRIVATE HEALTH INSURANCE | Attending: Neuromuscular Medicine | Primary: Family Medicine

## 2022-08-09 ENCOUNTER — Encounter: Admit: 2022-08-09 | Payer: PRIVATE HEALTH INSURANCE | Attending: Neuromuscular Medicine | Primary: Family Medicine

## 2022-08-09 DIAGNOSIS — F0390 Unspecified dementia without behavioral disturbance: Secondary | ICD-10-CM

## 2022-08-09 DIAGNOSIS — G301 Alzheimer's disease with late onset: Secondary | ICD-10-CM

## 2022-08-09 MED ORDER — QUETIAPINE IMMEDIATE RELEASE 25 MG TABLET
25 mg | ORAL_TABLET | 3 refills | Status: AC
Start: 2022-08-09 — End: 2022-08-20

## 2022-08-09 NOTE — Progress Notes
The following is the transcribed Review of Systems, done by the patient at Intake, and attached to this is the attending physician's documentation.Review of Systems Constitutional: Negative.  HENT: Positive for hearing loss.  Respiratory: Negative.  Gastrointestinal: Negative.  Musculoskeletal: Positive for joint pain. Neurological: Positive for tremors and headaches. Psychiatric/Behavioral: Positive for memory loss. The patient is nervous/anxious and has insomnia.

## 2022-08-09 NOTE — Progress Notes
CC: Dementia and behavior changesHPI: This is a 81 y.o. woman who presents with her sister and daughter-in-law due to dementia, most likely Alzheimer's disease, that started around 2019. They state that the patient was able to drive, go to church, and carry out her ADL's independently in 2017-2018. She currently requires assistance with activities of daily living such as taking a shower, using the bathroom, and putting on her clothes. Her family reports behavioral changes. They state that sometimes the patient gets agitated, stomps her feet, yells and attempts to leave the house. Currently, she has minimal verbal interaction with the family at home. Her family states that within the last 2 weeks, she has been keeping food in her mouth. The patient has tried medications such as benzodiapines and olanzapine which have not improved her symptoms. She is currently living with her family and goes to a daycare centre 3 days a week. Arrangements are being made to place the patient in an assisted living facility. This 81 year old woman was seen in new [facility] in September 2023 for dementia.  She was accompanied by her daughter-in-law on the patient had been living with as well as the sister of daughter in law.  Patient had moved in to live with her son and daughter-in-law perhaps around 2017 or so and she reportedly was cognitively well at that time and around 2020, around early COVID-19 pandemic, family members had started noticing short-term memory problems and she progressively declined from cognitive standpoint afterwards and by September 2023, she had been completely dependent in her ADLs and IADLs.  While she was able to walk, she would require help using the toilet, taking the shower and changing the clothes.  Her verbal output had also significantly decreased.  She also had behavioral symptoms and would stay up most of the nights all night and at times would try to leave the house and family members had put safety locks everywhere.  She had been previously seen in Neurology Clinic by Dr. Vernice Jefferson and was diagnosed to have Alzheimer's disease and she was also noted to have low vitamin B12.  It was not clear if she had any formal neuropsychological testing done at any point or not.  MRI of the brain on 06/05/2021 was reported to show minimal confluent periventricular white matter chronic small vessel ischemic changes and generalized cerebral volume loss.  She reportedly had previously been started on Aricept through the office of her previous neurologist but she was unable to tolerate that and developed behavioral side effects and it was discontinued.  Given the fact that she would stay up all night, family members had tried multiple different things including lorazepam, trazodone, olanzapine and melatonin with no appreciable benefit. Laboratory workup in July 2022 was unremarkable for TSH, folate and syphilis serology while vitamin B12 level was low normal at 334. Current Outpatient Medications Medication Sig ? OLANZapine Take 1 tablet (5 mg total) by mouth nightly. ? BD Integra Syringe FOR USE WITH VITAMIN B12....BOT COVERED ? cyanocobalamin INJECT WEEKLY FOR 5 WEEK FOR 4 MONTHS THEN MONTHLY AFTER No current facility-administered medications for this visit. Allergies: Patient has no known allergies.Past Medical History: Diagnosis Date ? Dementia (HC Code)  No past surgical history on file.History reviewed. No pertinent family history.Social History Tobacco Use ? Smoking status: Never ? Smokeless tobacco: Never Vaping Use ? Vaping Use: Never used Substance Use Topics ? Alcohol use: Not Currently   Comment: 2-3 A MONTH ? Drug use: Never ROS Positive Symptoms Comments General  none  Eyes none  Ears/Nose /Throat none  Cardiovascular none  Respiratory none  Skin none  Gastrointestinal none Genitourinary none  Musculoskeletal none  Neurologic none   Psychiatric memory loss  Endocrine none  Heme/Lynphatic none   Immunologic none   Physical Exam BP 116/80 (Site: l a, Position: Sitting, Cuff Size: Large)  - Pulse 70  - Wt 55.1 kg  - SpO2 100%  - BMI 24.52 kg/m? She was sitting in the chair.  She was awake and alert.  She would make eye contact.  She would tell me her name.  She had significantly decreased verbal output.  Extraocular movements were full or shortly.  There was no gross facial weakness.  There was no significant spasticity or rigidity.  She was able to raise both upper and lower extremities against gravity.  Reflexes were +1 and symmetrical. She was able to stand up and walk unassisted although walked slowly and cautiously.Assessment The clinical picture was considered to be suggestive of an acquired neuro degenerative disease process, likely Alzheimer's disease.PlanI reviewed my impression and plan with the patient's daughter-in-law and the sister of daughter-in-law who had accompanied the patient for the clinic visit.She had previously not tolerated Aricept.  We discussed the option of starting her on memantine although family members would like to avoid starting any new medication for Alzheimer's given her previous significant adverse effect to Aricept.Behavioral symptoms had been significantly bothersome particularly the fact that she would stay up all night and multiple medications had not helped.  We agreed to consider a trial of Seroquel.  I explained the potential adverse effects from cardiovascular standpoint and the risk of early mortality with the use of neuroleptics dementia and the family members understood and were agreeable to proceed.  I would consider slowly increasing the dose of Seroquel depending upon tolerability and response. I would also make a referral to Psychiatry.  I would discontinue olanzapine.I also suggested to start oral vitamin B12 supplementation.Family members had been trying to explore the possibility of placement at a long-term care facility.Follow-up in 4 months.  They would call our office sooner if needed. Greater than 65 minutes were spent reviewing patient's history and test results and discussing the differential diagnosis, work up and management and face-to-face counseling. The patient was instructed to call with any problems or questions. For all reported symptoms not discussed above the patient was referred to their primary medical doctor.Scribed for Daphene Jaeger, MD by Shane Crutch, medical scribe August 09, 2022  The documentation recorded by the scribe accurately reflects the services I personally performed and the decisions made by me. I reviewed and confirmed all material entered and/or pre-charted by the scribe.

## 2022-08-10 ENCOUNTER — Encounter: Admit: 2022-08-10 | Payer: PRIVATE HEALTH INSURANCE | Primary: Family Medicine

## 2022-08-20 ENCOUNTER — Encounter: Admit: 2022-08-20 | Payer: Medicare (Managed Care) | Primary: Family Medicine

## 2022-08-20 ENCOUNTER — Encounter: Admit: 2022-08-20 | Payer: PRIVATE HEALTH INSURANCE | Primary: Family Medicine

## 2022-08-20 ENCOUNTER — Ambulatory Visit: Admit: 2022-08-20 | Payer: Medicare (Managed Care) | Primary: Family Medicine

## 2022-08-20 MED ORDER — QUETIAPINE IMMEDIATE RELEASE 25 MG TABLET
25 | ORAL_TABLET | ORAL | 3 refills | 30.00000 days | Status: AC
Start: 2022-08-20 — End: 2022-09-03

## 2022-08-21 ENCOUNTER — Telehealth: Admit: 2022-08-21 | Payer: PRIVATE HEALTH INSURANCE | Primary: Family Medicine

## 2022-08-21 ENCOUNTER — Encounter: Admit: 2022-08-21 | Payer: PRIVATE HEALTH INSURANCE | Attending: Family Medicine | Primary: Family Medicine

## 2022-08-22 ENCOUNTER — Encounter: Admit: 2022-08-22 | Payer: PRIVATE HEALTH INSURANCE | Primary: Family Medicine

## 2022-08-22 ENCOUNTER — Encounter: Admit: 2022-08-22 | Payer: PRIVATE HEALTH INSURANCE | Attending: Family Medicine | Primary: Family Medicine

## 2022-08-22 NOTE — Progress Notes
CC reached [facility].  [facility] received a referral for services in August of this year, but the referral was discardered.  CC notified PCP and office of having no current homecare services.

## 2022-08-22 NOTE — Progress Notes
PCP asked this CC to call and assist family with Krista Koch's increased care needs due to dementia.  CC spoke to daughter in law, Krista Koch.  Krista Koch lives with son and daughter in law and requires 24/7 supervision.  She is independent with ambulation and uses no assistive devices.  She has been wandering around the home, especially at night.  She was just seen by psychiatrist and her Seroquel was increased to 50 MG PO QHS.  This has helped and Krista Koch is sleeping through the night.  Family wants to keep her at home and not send her to SNF.  They have toured SNFs and were not happy.  CC gave her [facility]a [facility] number.  Krista Koch currently goes to [facility] from 9-3 PM 3 days weekly.  DIL would like her to go more often, but it is $90.00/day.  CC called [facility] and left a message for RN to call back to discuss options.  Krista Koch was also referred to [facility].  CC called to check on referral and ask for MSW to be added for assistance with care needs.

## 2022-08-23 NOTE — Progress Notes
CC called and spoke to Holiday representative at [facility].  CC just informed the nurse there that family is needing more resources to care for Krista Koch at home.  There is no SW at the [facility].  This CC will work with [facility] when they start services to help coordinate any other resources.

## 2022-08-27 ENCOUNTER — Encounter: Admit: 2022-08-27 | Payer: PRIVATE HEALTH INSURANCE | Attending: Family Medicine | Primary: Family Medicine

## 2022-08-27 ENCOUNTER — Encounter: Admit: 2022-08-27 | Payer: PRIVATE HEALTH INSURANCE | Primary: Family Medicine

## 2022-08-27 NOTE — Progress Notes
[facility] returned my call and they did receive the referral.  The referral has been processed and they will be calling patient in the next 24-48 hours.Called DIL, Krista Koch, and she reports that [facility] has already contacted her about visit.

## 2022-08-27 NOTE — Progress Notes
John from [facility] called to report that Krista Koch will be seen 08/31/22.

## 2022-08-27 NOTE — Progress Notes
Called to see If [facility] received the referral from 08/22/22.  Left message with answering services to return my call.

## 2022-08-28 ENCOUNTER — Telehealth: Admit: 2022-08-28 | Payer: PRIVATE HEALTH INSURANCE | Primary: Family Medicine

## 2022-08-28 NOTE — Telephone Encounter
No, I sent an updated one last week when you were out of the office. Should be all set from your end.

## 2022-08-29 ENCOUNTER — Encounter: Admit: 2022-08-29 | Payer: PRIVATE HEALTH INSURANCE | Primary: Family Medicine

## 2022-08-30 ENCOUNTER — Encounter: Admit: 2022-08-30 | Payer: PRIVATE HEALTH INSURANCE | Attending: Family Medicine | Primary: Family Medicine

## 2022-08-30 ENCOUNTER — Telehealth: Admit: 2022-08-30 | Payer: PRIVATE HEALTH INSURANCE | Attending: Family Medicine | Primary: Family Medicine

## 2022-09-03 ENCOUNTER — Encounter: Admit: 2022-09-03 | Payer: Medicare (Managed Care) | Primary: Family Medicine

## 2022-09-03 ENCOUNTER — Encounter: Admit: 2022-09-03 | Payer: PRIVATE HEALTH INSURANCE | Primary: Family Medicine

## 2022-09-03 MED ORDER — QUETIAPINE IMMEDIATE RELEASE 25 MG TABLET
25 | ORAL_TABLET | ORAL | 3 refills | 30.00000 days | Status: AC
Start: 2022-09-03 — End: 2022-09-27

## 2022-09-10 ENCOUNTER — Encounter
Admit: 2022-09-10 | Payer: PRIVATE HEALTH INSURANCE | Attending: Vascular and Interventional Radiology | Primary: Family Medicine

## 2022-09-10 ENCOUNTER — Encounter: Admit: 2022-09-10 | Payer: PRIVATE HEALTH INSURANCE | Attending: Family Medicine | Primary: Family Medicine

## 2022-09-11 ENCOUNTER — Telehealth: Admit: 2022-09-11 | Payer: PRIVATE HEALTH INSURANCE | Primary: Family Medicine

## 2022-09-11 ENCOUNTER — Encounter: Admit: 2022-09-11 | Payer: PRIVATE HEALTH INSURANCE | Primary: Family Medicine

## 2022-09-12 ENCOUNTER — Encounter: Admit: 2022-09-12 | Payer: PRIVATE HEALTH INSURANCE | Attending: Family Medicine | Primary: Family Medicine

## 2022-09-12 ENCOUNTER — Telehealth: Admit: 2022-09-12 | Payer: PRIVATE HEALTH INSURANCE | Attending: Family Medicine | Primary: Family Medicine

## 2022-09-13 ENCOUNTER — Encounter: Admit: 2022-09-13 | Payer: PRIVATE HEALTH INSURANCE | Primary: Family Medicine

## 2022-09-17 ENCOUNTER — Encounter: Admit: 2022-09-17 | Payer: PRIVATE HEALTH INSURANCE | Attending: Family Medicine | Primary: Family Medicine

## 2022-09-19 ENCOUNTER — Encounter: Admit: 2022-09-19 | Payer: Medicare (Managed Care) | Attending: Family Medicine | Primary: Family Medicine

## 2022-09-19 ENCOUNTER — Encounter: Admit: 2022-09-19 | Payer: PRIVATE HEALTH INSURANCE | Attending: Family Medicine | Primary: Family Medicine

## 2022-09-19 ENCOUNTER — Telehealth: Admit: 2022-09-19 | Payer: PRIVATE HEALTH INSURANCE | Attending: Family Medicine | Primary: Family Medicine

## 2022-09-20 ENCOUNTER — Encounter: Admit: 2022-09-20 | Payer: PRIVATE HEALTH INSURANCE | Attending: Family Medicine | Primary: Family Medicine

## 2022-09-20 ENCOUNTER — Telehealth: Admit: 2022-09-20 | Payer: PRIVATE HEALTH INSURANCE | Attending: Family Medicine | Primary: Family Medicine

## 2022-09-21 ENCOUNTER — Encounter: Admit: 2022-09-21 | Payer: PRIVATE HEALTH INSURANCE | Attending: Family Medicine | Primary: Family Medicine

## 2022-09-24 ENCOUNTER — Encounter: Admit: 2022-09-24 | Payer: PRIVATE HEALTH INSURANCE | Attending: Family Medicine | Primary: Family Medicine

## 2022-09-24 MED ORDER — ENSURE ORAL LIQUID
Freq: Every day | ORAL | 3 refills | 30.00000 days | Status: AC
Start: 2022-09-24 — End: 2023-01-29

## 2022-09-27 ENCOUNTER — Encounter: Admit: 2022-09-27 | Payer: PRIVATE HEALTH INSURANCE | Attending: Family Medicine | Primary: Family Medicine

## 2022-09-27 ENCOUNTER — Encounter: Admit: 2022-09-27 | Payer: PRIVATE HEALTH INSURANCE | Primary: Family Medicine

## 2022-09-27 ENCOUNTER — Telehealth: Admit: 2022-09-27 | Payer: PRIVATE HEALTH INSURANCE | Primary: Family Medicine

## 2022-09-27 MED ORDER — QUETIAPINE IMMEDIATE RELEASE 100 MG TABLET
100 | ORAL_TABLET | Freq: Every evening | ORAL | 2 refills | 30.00000 days | Status: AC
Start: 2022-09-27 — End: 2022-10-29

## 2022-09-27 MED ORDER — QUETIAPINE IMMEDIATE RELEASE 25 MG TABLET
25 | ORAL_TABLET | ORAL | 2 refills | 30.00000 days | Status: AC
Start: 2022-09-27 — End: 2022-10-29

## 2022-10-01 ENCOUNTER — Telehealth: Admit: 2022-10-01 | Payer: PRIVATE HEALTH INSURANCE | Primary: Family Medicine

## 2022-10-01 ENCOUNTER — Encounter: Admit: 2022-10-01 | Payer: Medicare (Managed Care) | Primary: Family Medicine

## 2022-10-01 MED ORDER — MIRTAZAPINE 7.5 MG TABLET
7.5 | ORAL_TABLET | Freq: Every evening | ORAL | 3 refills | 30.00000 days | Status: AC
Start: 2022-10-01 — End: 2022-12-17

## 2022-10-02 ENCOUNTER — Encounter: Admit: 2022-10-02 | Payer: PRIVATE HEALTH INSURANCE | Attending: Family Medicine | Primary: Family Medicine

## 2022-10-03 ENCOUNTER — Encounter: Admit: 2022-10-03 | Payer: PRIVATE HEALTH INSURANCE | Attending: Family Medicine | Primary: Family Medicine

## 2022-10-09 ENCOUNTER — Encounter: Admit: 2022-10-09 | Payer: PRIVATE HEALTH INSURANCE | Attending: Family Medicine | Primary: Family Medicine

## 2022-10-15 ENCOUNTER — Encounter
Admit: 2022-10-15 | Payer: PRIVATE HEALTH INSURANCE | Attending: Vascular and Interventional Radiology | Primary: Family Medicine

## 2022-10-18 ENCOUNTER — Encounter: Admit: 2022-10-18 | Payer: PRIVATE HEALTH INSURANCE | Primary: Family Medicine

## 2022-10-22 ENCOUNTER — Telehealth: Admit: 2022-10-22 | Payer: PRIVATE HEALTH INSURANCE | Primary: Family Medicine

## 2022-10-22 ENCOUNTER — Encounter: Admit: 2022-10-22 | Payer: PRIVATE HEALTH INSURANCE | Attending: Family Medicine | Primary: Family Medicine

## 2022-10-22 ENCOUNTER — Encounter
Admit: 2022-10-22 | Payer: PRIVATE HEALTH INSURANCE | Attending: Vascular and Interventional Radiology | Primary: Family Medicine

## 2022-10-29 ENCOUNTER — Encounter
Admit: 2022-10-29 | Payer: PRIVATE HEALTH INSURANCE | Attending: Vascular and Interventional Radiology | Primary: Family Medicine

## 2022-10-29 ENCOUNTER — Encounter: Admit: 2022-10-29 | Payer: Medicare (Managed Care) | Primary: Family Medicine

## 2022-10-29 MED ORDER — QUETIAPINE IMMEDIATE RELEASE 100 MG TABLET
100 | ORAL_TABLET | Freq: Every evening | ORAL | 2 refills | 30.00000 days | Status: AC
Start: 2022-10-29 — End: 2023-01-28

## 2022-10-29 MED ORDER — QUETIAPINE IMMEDIATE RELEASE 25 MG TABLET
25 | ORAL_TABLET | ORAL | 2 refills | 30.00000 days | Status: AC
Start: 2022-10-29 — End: 2022-12-17

## 2022-11-05 ENCOUNTER — Telehealth: Admit: 2022-11-05 | Payer: PRIVATE HEALTH INSURANCE | Primary: Family Medicine

## 2022-11-05 ENCOUNTER — Encounter
Admit: 2022-11-05 | Payer: PRIVATE HEALTH INSURANCE | Attending: Vascular and Interventional Radiology | Primary: Family Medicine

## 2022-11-05 ENCOUNTER — Encounter: Admit: 2022-11-05 | Payer: PRIVATE HEALTH INSURANCE | Primary: Family Medicine

## 2022-11-05 NOTE — Progress Notes
Called and spoke to Krista Koch.  Provided her with [facility] [employer] number to ask for assistance with respite care at a SNF.,  Also gave her [facility] Call Center number to call me back if she needs any additional assistance.

## 2022-11-05 NOTE — Progress Notes
Call to daughter in law, Selena Batten, at request of PCP office.  Selena Batten is trying to coordinate respite care for Moshe Cipro so that she can go on vacation.  Marylu is working with [employer] and they may be able to help [employer] with placement and payment for 2 weeks of respite care in a SNF.  CC contact [facility] for assistance.  Will continue to help Kim.  CC to call [facility] [employer] to discuss options.

## 2022-11-08 ENCOUNTER — Encounter: Admit: 2022-11-08 | Payer: PRIVATE HEALTH INSURANCE | Attending: Family Medicine | Primary: Family Medicine

## 2022-11-12 ENCOUNTER — Encounter
Admit: 2022-11-12 | Payer: PRIVATE HEALTH INSURANCE | Attending: Vascular and Interventional Radiology | Primary: Family Medicine

## 2022-11-19 ENCOUNTER — Encounter
Admit: 2022-11-19 | Payer: PRIVATE HEALTH INSURANCE | Attending: Vascular and Interventional Radiology | Primary: Family Medicine

## 2022-11-26 ENCOUNTER — Encounter
Admit: 2022-11-26 | Payer: PRIVATE HEALTH INSURANCE | Attending: Vascular and Interventional Radiology | Primary: Family Medicine

## 2022-11-30 ENCOUNTER — Telehealth: Admit: 2022-11-30 | Payer: PRIVATE HEALTH INSURANCE | Primary: Family Medicine

## 2022-12-03 ENCOUNTER — Encounter
Admit: 2022-12-03 | Payer: PRIVATE HEALTH INSURANCE | Attending: Vascular and Interventional Radiology | Primary: Family Medicine

## 2022-12-06 ENCOUNTER — Encounter: Admit: 2022-12-06 | Payer: PRIVATE HEALTH INSURANCE | Primary: Family Medicine

## 2022-12-10 ENCOUNTER — Encounter: Admit: 2022-12-10 | Payer: PRIVATE HEALTH INSURANCE | Primary: Family Medicine

## 2022-12-10 ENCOUNTER — Encounter
Admit: 2022-12-10 | Payer: PRIVATE HEALTH INSURANCE | Attending: Vascular and Interventional Radiology | Primary: Family Medicine

## 2022-12-13 ENCOUNTER — Encounter: Admit: 2022-12-13 | Payer: PRIVATE HEALTH INSURANCE | Attending: Neuromuscular Medicine | Primary: Family Medicine

## 2022-12-13 DIAGNOSIS — G301 Alzheimer's disease with late onset: Secondary | ICD-10-CM

## 2022-12-13 DIAGNOSIS — F0390 Unspecified dementia without behavioral disturbance: Secondary | ICD-10-CM

## 2022-12-13 MED ORDER — ACETAMINOPHEN 325 MG TABLET
325 mg | ORAL | Status: AC
Start: 2022-12-13 — End: ?

## 2022-12-13 MED ORDER — ACETAMINOPHEN 650 MG RECTAL SUPPOSITORY
650 mg | RECTAL | Status: AC
Start: 2022-12-13 — End: ?

## 2022-12-13 MED ORDER — BISACODYL 10 MG RECTAL SUPPOSITORY
10 mg | RECTAL | Status: AC
Start: 2022-12-13 — End: ?

## 2022-12-13 MED ORDER — TRAZODONE 50 MG TABLET
50 mg | Freq: Two times a day (BID) | ORAL | Status: AC | PRN
Start: 2022-12-13 — End: 2022-12-17
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
## 2022-12-17 ENCOUNTER — Encounter: Admit: 2022-12-17 | Payer: PRIVATE HEALTH INSURANCE | Attending: Family Medicine | Primary: Family Medicine

## 2022-12-17 ENCOUNTER — Encounter
Admit: 2022-12-17 | Payer: PRIVATE HEALTH INSURANCE | Attending: Vascular and Interventional Radiology | Primary: Family Medicine

## 2022-12-17 ENCOUNTER — Encounter: Admit: 2022-12-17 | Payer: Medicare (Managed Care) | Primary: Family Medicine

## 2022-12-17 MED ORDER — MIRTAZAPINE 7.5 MG TABLET
7.5 | ORAL_TABLET | Freq: Every evening | ORAL | 3 refills | 30.00000 days | Status: AC
Start: 2022-12-17 — End: ?

## 2022-12-17 MED ORDER — QUETIAPINE IMMEDIATE RELEASE 25 MG TABLET
25 | ORAL_TABLET | ORAL | 2 refills | 30.00000 days | Status: AC
Start: 2022-12-17 — End: 2023-01-21

## 2022-12-17 MED ORDER — TRAZODONE 50 MG TABLET
50 | ORAL_TABLET | Freq: Two times a day (BID) | ORAL | 2 refills | 30.00000 days | Status: AC | PRN
Start: 2022-12-17 — End: 2023-01-21

## 2022-12-24 ENCOUNTER — Encounter
Admit: 2022-12-24 | Payer: PRIVATE HEALTH INSURANCE | Attending: Vascular and Interventional Radiology | Primary: Family Medicine

## 2022-12-31 ENCOUNTER — Encounter
Admit: 2022-12-31 | Payer: PRIVATE HEALTH INSURANCE | Attending: Vascular and Interventional Radiology | Primary: Family Medicine

## 2023-01-21 ENCOUNTER — Encounter: Admit: 2023-01-21 | Payer: Medicare (Managed Care) | Primary: Family Medicine

## 2023-01-21 MED ORDER — QUETIAPINE IMMEDIATE RELEASE 25 MG TABLET
25 | ORAL_TABLET | ORAL | 2 refills | 30.00000 days | Status: AC
Start: 2023-01-21 — End: ?

## 2023-01-21 MED ORDER — TRAZODONE 50 MG TABLET
50 | ORAL_TABLET | Freq: Two times a day (BID) | ORAL | 2 refills | 30.00000 days | Status: AC | PRN
Start: 2023-01-21 — End: ?

## 2023-01-28 ENCOUNTER — Encounter: Admit: 2023-01-28 | Payer: Medicare (Managed Care) | Primary: Family Medicine

## 2023-01-28 MED ORDER — QUETIAPINE IMMEDIATE RELEASE 100 MG TABLET
100 | ORAL_TABLET | Freq: Every evening | ORAL | 2 refills | 30.00000 days | Status: AC
Start: 2023-01-28 — End: ?

## 2023-01-29 ENCOUNTER — Encounter: Admit: 2023-01-29 | Payer: Medicare (Managed Care) | Attending: Family Medicine | Primary: Family Medicine

## 2023-01-29 ENCOUNTER — Encounter: Admit: 2023-01-29 | Payer: PRIVATE HEALTH INSURANCE | Attending: Family Medicine | Primary: Family Medicine

## 2023-01-29 MED ORDER — CLOTRIMAZOLE 1 % TOPICAL CREAM
1 | Freq: Two times a day (BID) | TOPICAL | 3 refills | 15.00000 days | Status: AC
Start: 2023-01-29 — End: ?

## 2023-01-31 ENCOUNTER — Encounter: Admit: 2023-01-31 | Payer: PRIVATE HEALTH INSURANCE | Attending: Family Medicine | Primary: Family Medicine

## 2023-02-04 ENCOUNTER — Telehealth: Admit: 2023-02-04 | Payer: PRIVATE HEALTH INSURANCE | Attending: Family Medicine | Primary: Family Medicine

## 2023-02-05 ENCOUNTER — Encounter: Admit: 2023-02-05 | Payer: PRIVATE HEALTH INSURANCE | Primary: Family Medicine
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
## 2023-02-05 NOTE — Telephone Encounter
Thank you for the update. It looks like finances are the limiting factor for placement at this point.

## 2023-02-07 ENCOUNTER — Telehealth: Admit: 2023-02-07 | Payer: PRIVATE HEALTH INSURANCE | Attending: Family Medicine | Primary: Family Medicine

## 2023-02-07 ENCOUNTER — Encounter: Admit: 2023-02-07 | Payer: PRIVATE HEALTH INSURANCE | Attending: Family Medicine | Primary: Family Medicine

## 2023-02-08 ENCOUNTER — Encounter: Admit: 2023-02-08 | Payer: PRIVATE HEALTH INSURANCE | Primary: Family Medicine

## 2023-02-08 ENCOUNTER — Telehealth: Admit: 2023-02-08 | Payer: PRIVATE HEALTH INSURANCE | Primary: Family Medicine

## 2023-02-11 ENCOUNTER — Encounter: Admit: 2023-02-11 | Payer: PRIVATE HEALTH INSURANCE | Primary: Family Medicine

## 2023-02-11 ENCOUNTER — Encounter: Admit: 2023-02-11 | Payer: Medicare (Managed Care) | Attending: Family Medicine | Primary: Family Medicine

## 2023-02-11 ENCOUNTER — Encounter: Admit: 2023-02-11 | Payer: PRIVATE HEALTH INSURANCE | Attending: Family Medicine | Primary: Family Medicine

## 2023-02-11 ENCOUNTER — Encounter: Admit: 2023-02-11 | Payer: Medicare (Managed Care) | Primary: Family Medicine

## 2023-02-25 ENCOUNTER — Encounter: Admit: 2023-02-25 | Payer: Medicare (Managed Care) | Attending: Family Medicine | Primary: Family Medicine

## 2023-03-04 ENCOUNTER — Encounter
Admit: 2023-03-04 | Payer: PRIVATE HEALTH INSURANCE | Attending: Vascular and Interventional Radiology | Primary: Family Medicine

## 2023-06-13 ENCOUNTER — Encounter: Admit: 2023-06-13 | Payer: PRIVATE HEALTH INSURANCE | Attending: Neuromuscular Medicine | Primary: Family Medicine
